# Patient Record
Sex: Female | Born: 1995 | Race: White | Hispanic: No | Marital: Single | Smoking: Never smoker
Health system: Southern US, Community
[De-identification: ages and names within clinical notes are randomized; demographics above are authoritative.]

---
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
## 2016-03-16 ENCOUNTER — Encounter: Payer: Self-pay | Admitting: *Deleted

## 2016-03-16 MED ORDER — PREDNISONE 10 MG PO TABS: ORAL_TABLET | ORAL | 0 refills | Status: DC

## 2016-03-20 ENCOUNTER — Other Ambulatory Visit: Payer: Self-pay | Admitting: *Deleted

## 2016-03-20 MED ORDER — PREDNISONE 10 MG PO TABS: ORAL_TABLET | ORAL | 0 refills | Status: DC

## 2016-04-13 ENCOUNTER — Encounter: Payer: Self-pay | Admitting: Student

## 2016-04-13 ENCOUNTER — Ambulatory Visit (INDEPENDENT_AMBULATORY_CARE_PROVIDER_SITE_OTHER): Payer: PPO | Admitting: Student

## 2016-04-13 DIAGNOSIS — M76829 Posterior tibial tendinitis, unspecified leg: Secondary | ICD-10-CM | POA: Diagnosis not present

## 2016-04-13 NOTE — Assessment & Plan Note (Signed)
Fitted her for Green insoles with scaphoid pads. Ankle sleeve was given to help with compression. Offered medications that she would like to take over-the-counter ibuprofen or Aleve. She'll follow-up if needed in training room at [facility].

## 2016-04-13 NOTE — Progress Notes (Signed)
  Tiffany Sutton - 21 y.o. female MRN [id number]  Date of birth: Oct 25, 1995  SUBJECTIVE:  Including CC & ROS.  CC: b/l ankle pain  Presents with bilateral ankle pain that has been ongoing for the past week or so. 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  She has not had this issue before. She has had it taped which helps somewhat. Denies any numbness or tingling. Mainly on the medial aspect. When seen in the training room was noted that she did pronate.   ROS: No unexpected weight loss, fever, chills, swelling, instability, muscle pain, numbness/tingling, redness, otherwise see HPI   PMHx - Updated and reviewed.  Contributory factors include: Negative PSHx - Updated and reviewed.  Contributory factors include:  Negative FHx - Updated and reviewed.  Contributory factors include:  Negative Social Hx - Updated and reviewed. 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  Psych:  alert and oriented  Ankle & Foot: No visible swelling, ecchymosis, erythema, ulcers, calluses, blister Arch: Normal w/o pes cavus or planus  Achilles tendon without nodules or tenderness No swelling of retrocalcaneal bursa TTP over posterior tibialis tendon bilaterally No tenderness on lateral and medial malleolus No sign of peroneal tendon subluxations or tenderness to palpation Full in plantarflexion, dorsiflexion, inversion, and eversion of the foot; flexion and extension of the toes Strength: 5/5 in all directions. Sensation: intact Vascular: intact w/  dorsalis pedis & posterior tibialis pulses 2+ Stable lateral and medial ligaments; Negative Anterior drawer test    ASSESSMENT & PLAN:   Tibialis posterior tendinitis Fitted her for Green insoles with scaphoid pads. Ankle sleeve was given to help with compression. Offered medications that she would like to take over-the-counter ibuprofen or Aleve. She'll follow-up if needed in training room at [facility].

## 2016-04-19 ENCOUNTER — Ambulatory Visit (INDEPENDENT_AMBULATORY_CARE_PROVIDER_SITE_OTHER): Payer: PPO | Admitting: Student

## 2016-04-19 ENCOUNTER — Encounter: Payer: Self-pay | Admitting: Student

## 2016-04-19 VITALS — BP 104/62 | Ht 65.0 in | Wt 125.0 lb

## 2016-04-19 DIAGNOSIS — M76829 Posterior tibial tendinitis, unspecified leg: Secondary | ICD-10-CM

## 2016-04-19 NOTE — Assessment & Plan Note (Signed)
Custom orthotics made. We'll see if this helps. Follow-up as needed.

## 2016-04-19 NOTE — Progress Notes (Signed)
  Tiffany Sutton - 21 y.o. female MRN [id number]  Date of birth: 1995-12-11  Patient was fitted for a standard, cushioned, semi-rigid orthotic. Gr inserts did not seem to help with taping does. She was seen by Dr. Thurston Hole who recommended trying custom orthotics. X-rays were taken which did not show any stress fracture. The orthotic was heated and afterward the patient stood on the orthotic blank positioned on the orthotic stand. The patient was positioned in subtalar neutral position and 10 degrees of ankle dorsiflexion in a weight bearing stance. After completion of molding, a stable base was applied to the orthotic blank. The blank was ground to a stable position for weight bearing. Size: 6 Base: Blue EVA Additional Posting and Padding: None The patient ambulated these, and they were very comfortable.  I spent 40 minutes with this patient, greater than 50% was face-to-face time counseling regarding the below diagnosis.

## 2017-08-03 IMAGING — MR MR LUMBAR SPINE W/O CM
4 of 5 series · 19 of 48 positions shown · non-contrast
Comparison: Previous radiograph from 04/21/2015.

CLINICAL DATA: Initial evaluation for low back pain for 2 weeks
after hard intact while playing lacrosse.

EXAM:
MRI LUMBAR SPINE WITHOUT CONTRAST
TECHNIQUE: Multiplanar, multisequence MR imaging of the lumbar spine was
performed. No intravenous contrast was administered.

[Series 5: T2 · sagittal · 4.0mm · 0.81mm/px · 6 of 13 slices shown (1 of 2)]
[im 1/13]
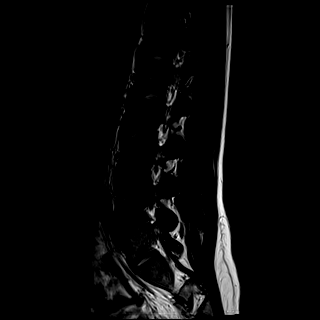
[im 3/13]
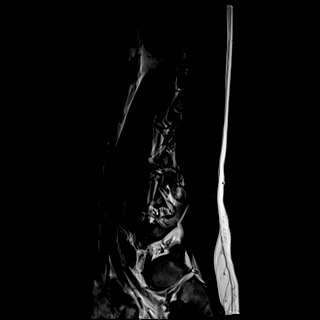
[im 5/13]
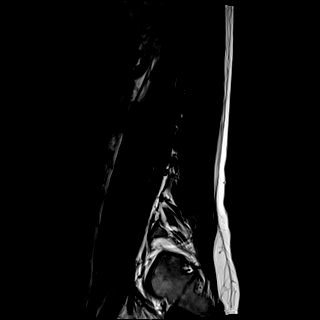
[im 8/13]
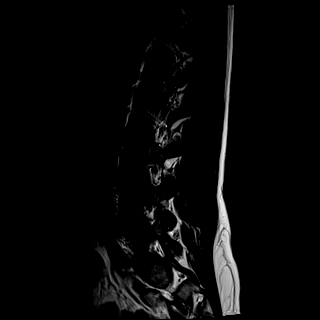
[im 10/13]
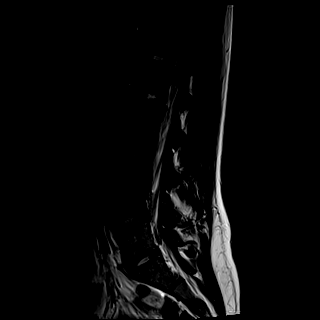
[im 13/13]
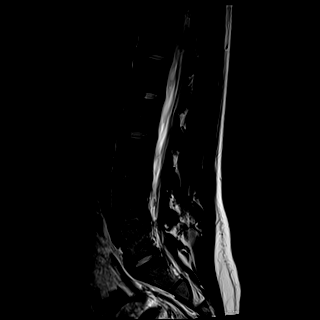

[Series 7: T1 · sagittal · 4.0mm · 0.81mm/px · 3 of 13 slices shown (1 of 2)]
[im 3/13]
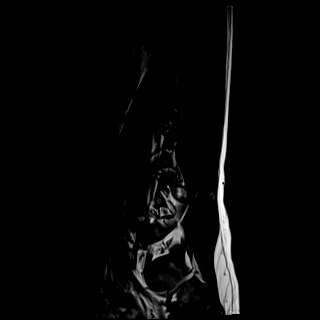
[im 8/13]
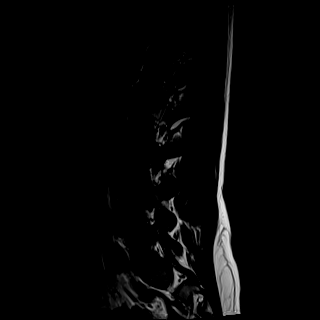
[im 13/13]
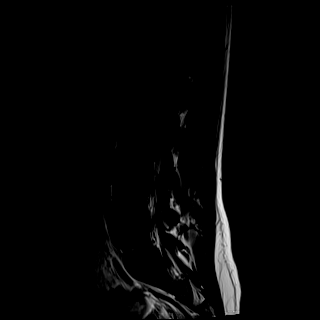

[Series 10: T1 · axial · 4.0mm · 0.28mm/px · z∈[-81,+66]mm · 3 of 34 slices shown (2 of 2)]
[im 5/34]
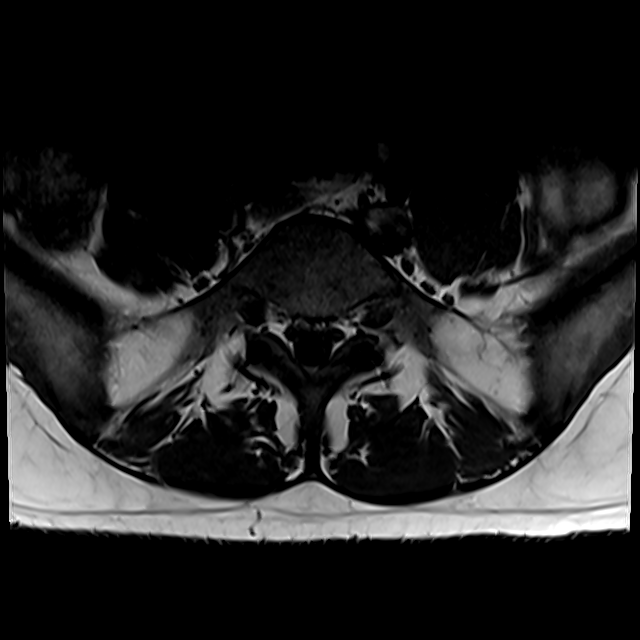
[im 17/34]
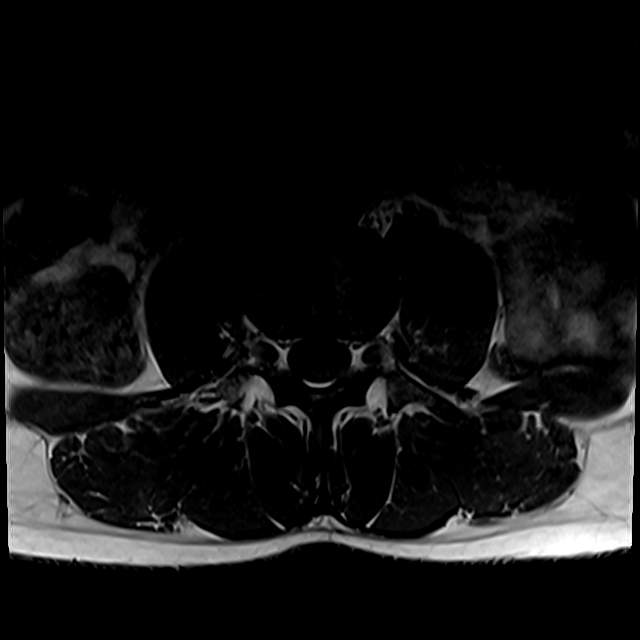
[im 29/34]
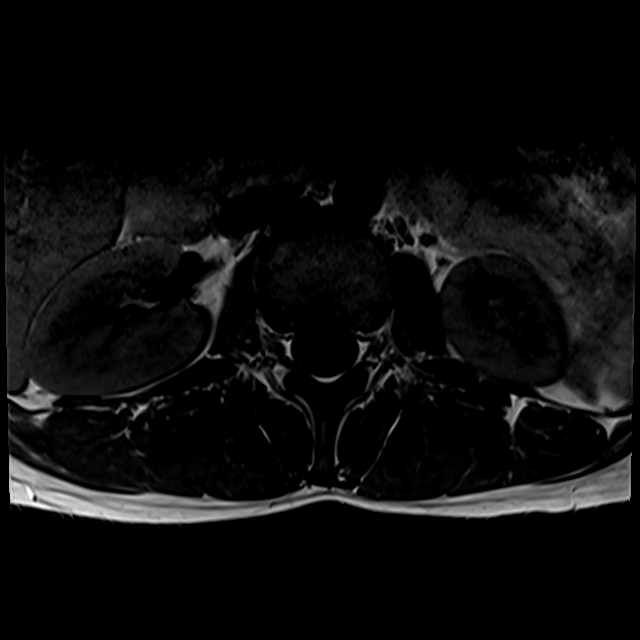

[Series 13: T2 · axial · 4.0mm · 0.28mm/px · z∈[-101,+66]mm · 7 of 34 slices shown (2 of 2)]
[im 1/34]
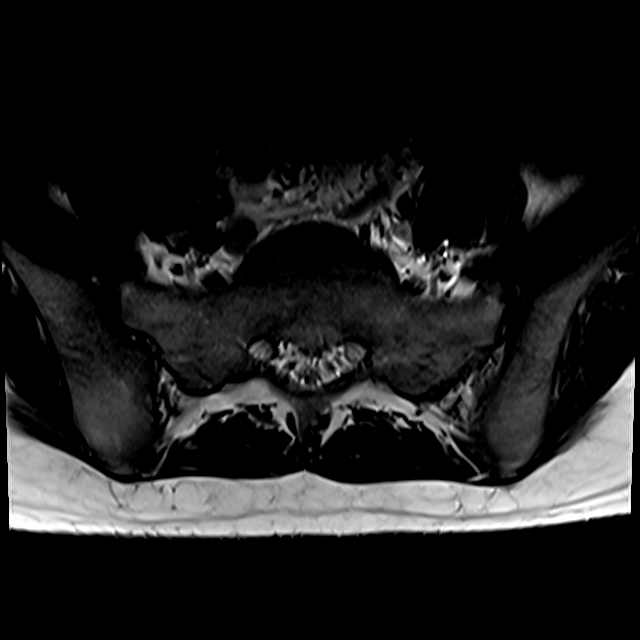
[im 5/34]
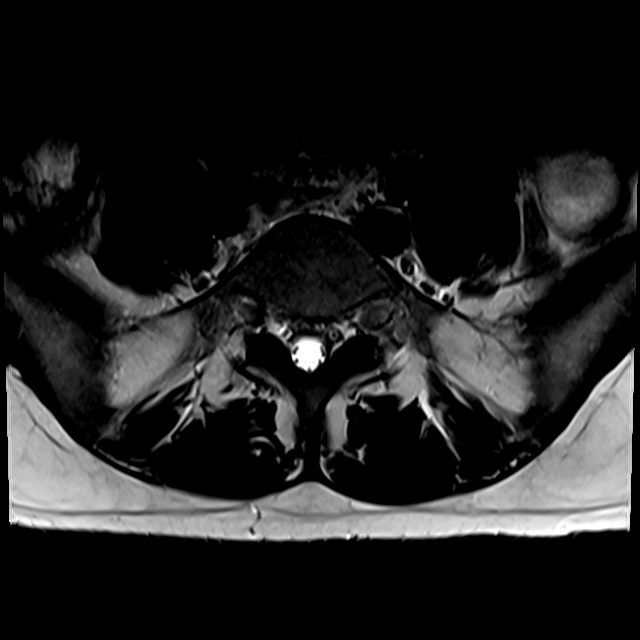
[im 10/34]
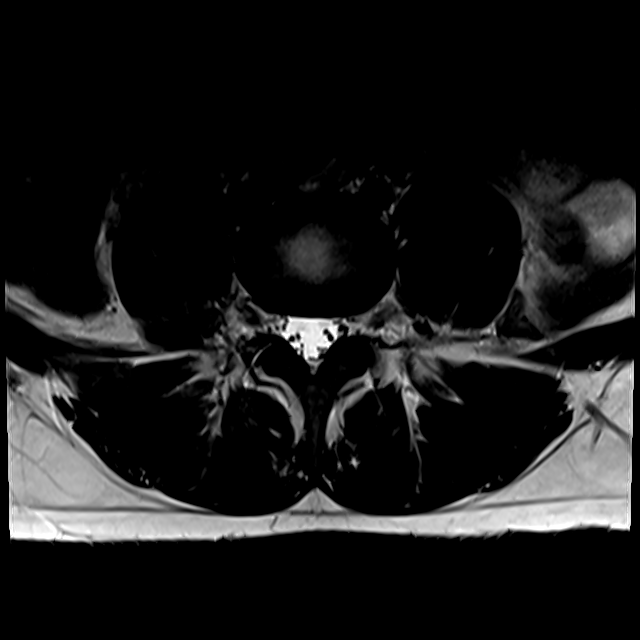
[im 15/34]
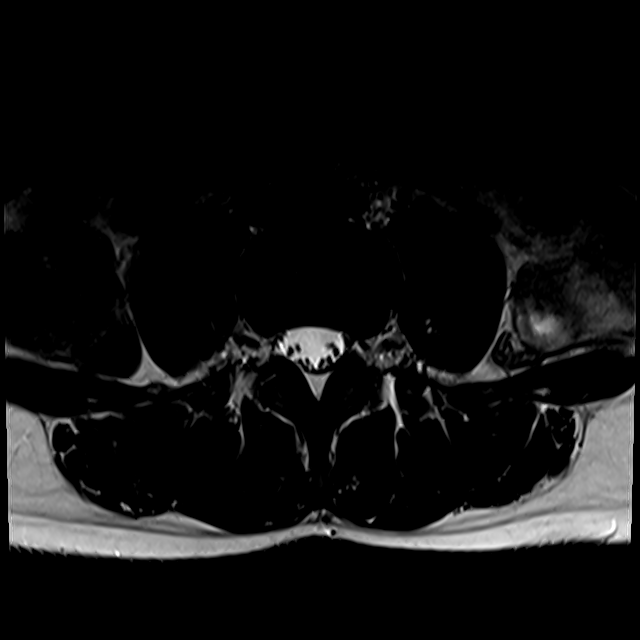
[im 17/34]
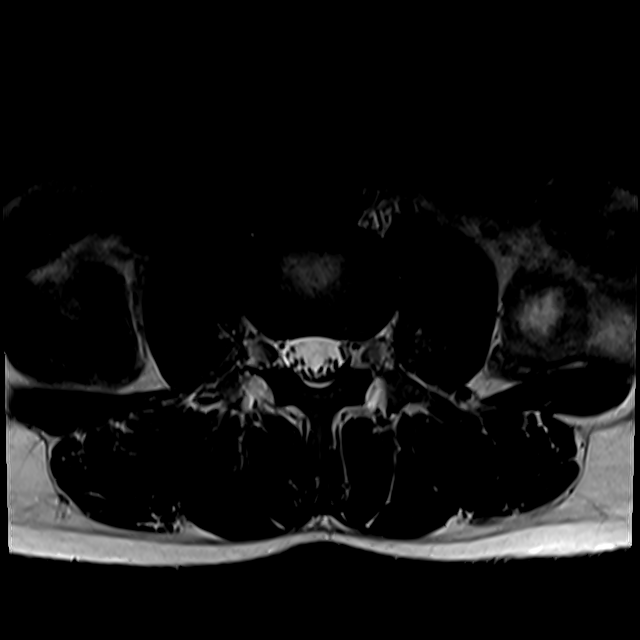
[im 19/34]
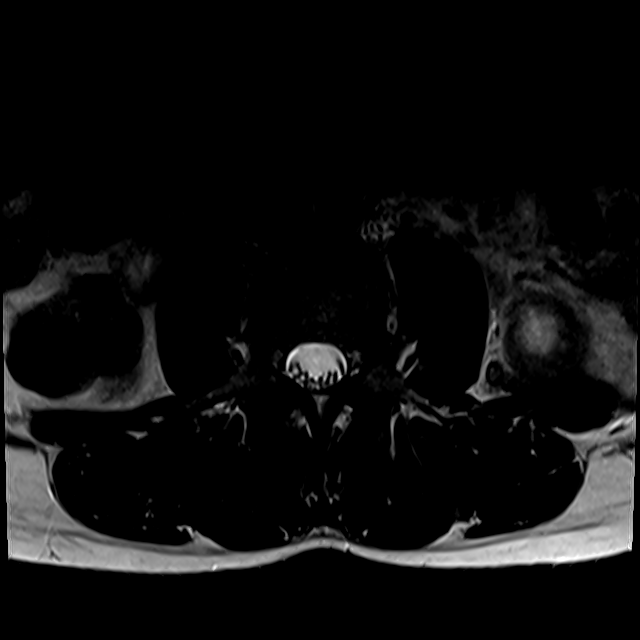
[im 29/34]
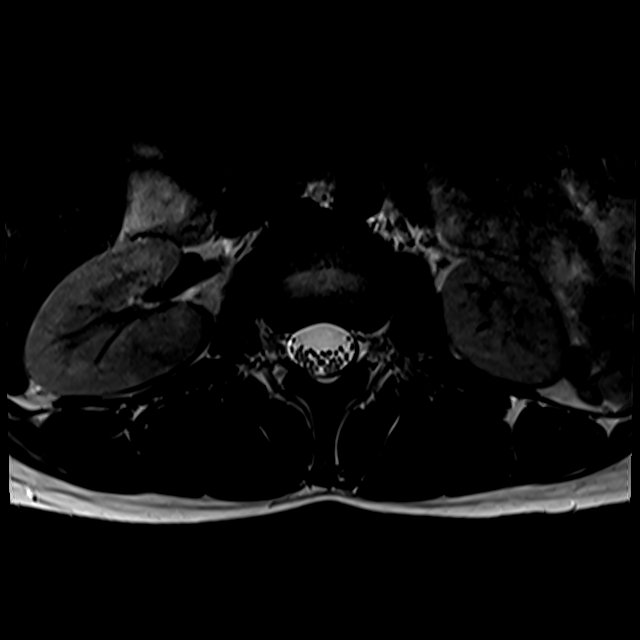

[19 of 48 positions shown; findings below may reference images not displayed]

FINDINGS: For the purposes of this dictation, the lowest well-formed
intervertebral disc spaces presumed to be the L5-S1 level, and there
are presumed to be 5 lumbar type vertebral bodies.

Vertebral bodies are normally aligned with preservation of the
normal lumbar lordosis. Vertebral body heights are well maintained.
No acute or chronic fracture. Small chronic endplate Schmorl's node
noted at the superior endplate of T12. No other degenerative
Schmorl's nodes identified. Signal intensity within the vertebral
body bone marrow is normal. No focal osseous lesions. No marrow
edema.

Conus medullaris terminates normally at the L1 level. Mild
prominence of the central canal within the distal conus measures
approximately 1.5 mm. This is felt to be within normal limits, and
not reflective of an underlying syrinx. Signal intensity within the
visualized cord is otherwise normal. Nerve roots of the cauda equina
within normal limits.

Paraspinous soft tissues are normal in appearance. No
retroperitoneal adenopathy. Visualized facial structures
unremarkable.

T11-12: Small left paracentral disc protrusion indents the ventral
thecal sac (series 13, image 3). Associated 5 mm caudad migration.
No significant canal stenosis. Foramina remain widely patent at this
level.

No other significant degenerative changes identified within the
lumbar spine. No other disc bulge or disc protrusion. No significant
facet disease. No canal or foraminal stenosis. No evidence for
neural impingement.
IMPRESSION: 1. Small left paracentral disc protrusion at T11-12 without
significant stenosis.
2. Otherwise normal MRI of the lumbar spine. No other significant
degenerative changes or focal disc herniation. No significant canal
or foraminal stenosis.
3. Incidental note made of mild prominence of the central canal
within the conus medullaris, measuring less than 2 mm in diameter.
This is felt to be within normal limits, and not representative of a
true syrinx.
